# Patient Record
Sex: Female | Born: 1985 | Race: White | Hispanic: No | Marital: Married | Smoking: Current every day smoker
Health system: Southern US, Community
[De-identification: ages and names within clinical notes are randomized; demographics above are authoritative.]

## PROBLEM LIST (undated history)

## (undated) DIAGNOSIS — N200 Calculus of kidney: Secondary | ICD-10-CM

## (undated) HISTORY — PX: TUBAL LIGATION: SHX77

## (undated) HISTORY — PX: CHOLECYSTECTOMY: SHX55

---

## 2013-12-21 ENCOUNTER — Emergency Department (HOSPITAL_BASED_OUTPATIENT_CLINIC_OR_DEPARTMENT_OTHER)
Admission: EM | Admit: 2013-12-21 | Discharge: 2013-12-21 | Disposition: A | Discharge status: Home / Self Care | Payer: Worker's Compensation | Attending: Emergency Medicine | Admitting: Emergency Medicine

## 2013-12-21 ENCOUNTER — Encounter (HOSPITAL_BASED_OUTPATIENT_CLINIC_OR_DEPARTMENT_OTHER): Payer: Self-pay | Admitting: Emergency Medicine

## 2013-12-21 DIAGNOSIS — S61209A Unspecified open wound of unspecified finger without damage to nail, initial encounter: Secondary | ICD-10-CM | POA: Diagnosis present

## 2013-12-21 DIAGNOSIS — F17200 Nicotine dependence, unspecified, uncomplicated: Secondary | ICD-10-CM | POA: Diagnosis not present

## 2013-12-21 DIAGNOSIS — W268XXA Contact with other sharp object(s), not elsewhere classified, initial encounter: Secondary | ICD-10-CM | POA: Diagnosis not present

## 2013-12-21 DIAGNOSIS — Y9389 Activity, other specified: Secondary | ICD-10-CM | POA: Insufficient documentation

## 2013-12-21 DIAGNOSIS — S61215A Laceration without foreign body of left ring finger without damage to nail, initial encounter: Secondary | ICD-10-CM

## 2013-12-21 DIAGNOSIS — Z87442 Personal history of urinary calculi: Secondary | ICD-10-CM | POA: Diagnosis not present

## 2013-12-21 DIAGNOSIS — Y929 Unspecified place or not applicable: Secondary | ICD-10-CM | POA: Diagnosis not present

## 2013-12-21 HISTORY — DX: Calculus of kidney: N20.0

## 2013-12-21 NOTE — ED Provider Notes (Signed)
CSN: [id number]     Arrival date & time 12/21/13  0827 History   First MD Initiated Contact with Patient 12/21/13 [phone]     Chief Complaint  Patient presents with  . Extremity Laceration     (Consider location/radiation/quality/duration/timing/severity/associated sxs/prior Treatment) HPI Comments: Patient is a 28 year old female. She is lacerated the tip of her left ring finger while slicing lettuce. This was done at work. Last tetanus shot was 3 months ago.  The history is provided by the patient.    Past Medical History  Diagnosis Date  . Nephrolithiasis    Past Surgical History  Procedure Laterality Date  . Cholecystectomy    . Cesarean section     No family history on file. History  Substance Use Topics  . Smoking status: Current Every Day Smoker  . Smokeless tobacco: Not on file  . Alcohol Use: No   OB History   Grav Para Term Preterm Abortions TAB SAB Ect Mult Living                 Review of Systems  All other systems reviewed and are negative.     Allergies  Bactrim  Home Medications   Prior to Admission medications   Not on File   BP 122/82  Pulse 99  Temp(Src) 98.3 F (36.8 C) (Oral)  Resp 14  Ht  (1.549 m)  Wt 180 lb (81.647 kg)  BMI 34.03 kg/m2  SpO2 100%  LMP 12/21/2013 Physical Exam  Constitutional: She is oriented to person, place, and time. She appears well-developed and well-nourished.  HENT:  Head: Normocephalic and atraumatic.  Neck: Normal range of motion. Neck supple.  Neurological: She is alert and oriented to person, place, and time.  Skin: Skin is warm and dry.  The left ring finger is noted to have a laceration causing a small flap. The laceration extends through the lateral edge of the nail.    ED Course  Procedures (including critical care time) Labs Review Labs Reviewed - No data to display  Imaging Review No results found.   EKG Interpretation None      MDM   Final diagnoses:  None    Laceration  repaired with Dermabond without the use of any sedation or local anesthesia. She will be placed in a dressing and advised to followup as needed.    Geoffery Lyons, MD 12/21/13 [phone]

## 2013-12-21 NOTE — Discharge Instructions (Signed)
Keep laceration covered. Wear splint as applied for comfort and protection.  Return to the emergency department if you experience any difficulties.   Laceration Care, Adult A laceration is a cut or lesion that goes through all layers of the skin and into the tissue just beneath the skin. TREATMENT  Some lacerations may not require closure. Some lacerations may not be able to be closed due to an increased risk of infection. It is important to see your caregiver as soon as possible after an injury to minimize the risk of infection and maximize the opportunity for successful closure. If closure is appropriate, pain medicines may be given, if needed. The wound will be cleaned to help prevent infection. Your caregiver will use stitches (sutures), staples, wound glue (adhesive), or skin adhesive strips to repair the laceration. These tools bring the skin edges together to allow for faster healing and a better cosmetic outcome. However, all wounds will heal with a scar. Once the wound has healed, scarring can be minimized by covering the wound with sunscreen during the day for 1 full year. HOME CARE INSTRUCTIONS  For sutures or staples:  Keep the wound clean and dry.  If you were given a bandage (dressing), you should change it at least once a day. Also, change the dressing if it becomes wet or dirty, or as directed by your caregiver.  Wash the wound with soap and water 2 times a day. Rinse the wound off with water to remove all soap. Pat the wound dry with a clean towel.  After cleaning, apply a thin layer of the antibiotic ointment as recommended by your caregiver. This will help prevent infection and keep the dressing from sticking.  You may shower as usual after the first 24 hours. Do not soak the wound in water until the sutures are removed.  Only take over-the-counter or prescription medicines for pain, discomfort, or fever as directed by your caregiver.  Get your sutures or staples removed  as directed by your caregiver. For skin adhesive strips:  Keep the wound clean and dry.  Do not get the skin adhesive strips wet. You may bathe carefully, using caution to keep the wound dry.  If the wound gets wet, pat it dry with a clean towel.  Skin adhesive strips will fall off on their own. You may trim the strips as the wound heals. Do not remove skin adhesive strips that are still stuck to the wound. They will fall off in time. For wound adhesive:  You may briefly wet your wound in the shower or bath. Do not soak or scrub the wound. Do not swim. Avoid periods of heavy perspiration until the skin adhesive has fallen off on its own. After showering or bathing, gently pat the wound dry with a clean towel.  Do not apply liquid medicine, cream medicine, or ointment medicine to your wound while the skin adhesive is in place. This may loosen the film before your wound is healed.  If a dressing is placed over the wound, be careful not to apply tape directly over the skin adhesive. This may cause the adhesive to be pulled off before the wound is healed.  Avoid prolonged exposure to sunlight or tanning lamps while the skin adhesive is in place. Exposure to ultraviolet light in the first year will darken the scar.  The skin adhesive will usually remain in place for 5 to 10 days, then naturally fall off the skin. Do not pick at the adhesive film. You  may need a tetanus shot if:  You cannot remember when you had your last tetanus shot.  You have never had a tetanus shot. If you get a tetanus shot, your arm may swell, get red, and feel warm to the touch. This is common and not a problem. If you need a tetanus shot and you choose not to have one, there is a rare chance of getting tetanus. Sickness from tetanus can be serious. SEEK MEDICAL CARE IF:   You have redness, swelling, or increasing pain in the wound.  You see a red line that goes away from the wound.  You have yellowish-white fluid  (pus) coming from the wound.  You have a fever.  You notice a bad smell coming from the wound or dressing.  Your wound breaks open before or after sutures have been removed.  You notice something coming out of the wound such as wood or glass.  Your wound is on your hand or foot and you cannot move a finger or toe. SEEK IMMEDIATE MEDICAL CARE IF:   Your pain is not controlled with prescribed medicine.  You have severe swelling around the wound causing pain and numbness or a change in color in your arm, hand, leg, or foot.  Your wound splits open and starts bleeding.  You have worsening numbness, weakness, or loss of function of any joint around or beyond the wound.  You develop painful lumps near the wound or on the skin anywhere on your body. MAKE SURE YOU:   Understand these instructions.  Will watch your condition.  Will get help right away if you are not doing well or get worse. Document Released: 04/02/2005 Document Revised: 06/25/2011 Document Reviewed: 09/26/2010 [facility] Patient Information 2015 [place], [place]. This information is not intended to replace advice given to you by your health care provider. Make sure you discuss any questions you have with your health care provider.

## 2013-12-21 NOTE — ED Notes (Signed)
Pt sts cut finger while using slicer at work. Laceration to tip of left 4th digit. Bleeding minimal at this time. Pressure being held with gauze. Last tetanus 3 months ago.

## 2013-12-23 ENCOUNTER — Encounter (HOSPITAL_BASED_OUTPATIENT_CLINIC_OR_DEPARTMENT_OTHER): Payer: Self-pay | Admitting: Emergency Medicine

## 2013-12-23 ENCOUNTER — Emergency Department (HOSPITAL_BASED_OUTPATIENT_CLINIC_OR_DEPARTMENT_OTHER)
Admission: EM | Admit: 2013-12-23 | Discharge: 2013-12-23 | Disposition: A | Discharge status: Home / Self Care | Payer: Worker's Compensation | Attending: Emergency Medicine | Admitting: Emergency Medicine

## 2013-12-23 ENCOUNTER — Emergency Department (HOSPITAL_BASED_OUTPATIENT_CLINIC_OR_DEPARTMENT_OTHER): Payer: Worker's Compensation

## 2013-12-23 DIAGNOSIS — F17200 Nicotine dependence, unspecified, uncomplicated: Secondary | ICD-10-CM | POA: Diagnosis not present

## 2013-12-23 DIAGNOSIS — Z4801 Encounter for change or removal of surgical wound dressing: Secondary | ICD-10-CM | POA: Insufficient documentation

## 2013-12-23 DIAGNOSIS — Z5189 Encounter for other specified aftercare: Secondary | ICD-10-CM

## 2013-12-23 DIAGNOSIS — Z87442 Personal history of urinary calculi: Secondary | ICD-10-CM | POA: Insufficient documentation

## 2013-12-23 MED ORDER — IBUPROFEN 800 MG PO TABS
800.0000 mg | ORAL_TABLET | Freq: Once | ORAL | Status: AC
  Administered 2013-12-23: 800 mg via ORAL
  Filled 2013-12-23: qty 1

## 2013-12-23 MED ORDER — NAPROXEN 375 MG PO TABS: 375.0000 mg | ORAL_TABLET | Freq: Two times a day (BID) | ORAL | Status: AC

## 2013-12-23 MED ORDER — CEPHALEXIN 500 MG PO CAPS: 500.0000 mg | ORAL_CAPSULE | Freq: Three times a day (TID) | ORAL | Status: DC

## 2013-12-23 NOTE — ED Notes (Signed)
MD at bedside. 

## 2013-12-23 NOTE — ED Notes (Signed)
Laceration to left 4th finger 9/7.  Repaired here.  Pt sts her finger is throbbing and has shooting pains that are getting worse.

## 2013-12-23 NOTE — ED Provider Notes (Signed)
CSN: [id number]     Arrival date & time 12/23/13  0026 History   First MD Initiated Contact with Patient 12/23/13 0054     Chief Complaint  Patient presents with  . Wound Check     (Consider location/radiation/quality/duration/timing/severity/associated sxs/prior Treatment) Patient is a 28 y.o. female presenting with wound check. The history is provided by the patient.  Wound Check This is a new problem. The current episode started 2 days ago. The problem occurs constantly. The problem has not changed since onset.Pertinent negatives include no chest pain, no abdominal pain, no headaches and no shortness of breath. Nothing aggravates the symptoms. Nothing relieves the symptoms. She has tried nothing for the symptoms. The treatment provided no relief.  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  No redness no warmth no fluctuance  Past Medical History  Diagnosis Date  . Nephrolithiasis    Past Surgical History  Procedure Laterality Date  . Cholecystectomy    . Cesarean section    . Tubal ligation     No family history on file. History  Substance Use Topics  . Smoking status: Current Every Day Smoker -- 0.50 packs/day  . Smokeless tobacco: Not on file  . Alcohol Use: No   OB History   Grav Para Term Preterm Abortions TAB SAB Ect Mult Living                 Review of Systems  Respiratory: Negative for shortness of breath.   Cardiovascular: Negative for chest pain.  Gastrointestinal: Negative for abdominal pain.  Neurological: Negative for headaches.  All other systems reviewed and are negative.     Allergies  Bactrim  Home Medications   Prior to Admission medications   Not on File   BP 126/84  Pulse 72  Temp(Src) 98.1 F (36.7 C) (Oral)  Resp 18  Ht  (1.549 m)  Wt 180 lb (81.647 kg)  BMI 34.03 kg/m2  SpO2 99%  LMP 12/21/2013 Physical Exam  Constitutional: She is oriented to person, place, and  time. She appears well-developed and well-nourished. No distress.  HENT:  Head: Normocephalic and atraumatic.  Mouth/Throat: Oropharynx is clear and moist.  Eyes: Conjunctivae and EOM are normal.  Neck: Normal range of motion. Neck supple.  Cardiovascular: Normal rate, regular rhythm and intact distal pulses.   Pulmonary/Chest: Effort normal and breath sounds normal. She has no wheezes.  Abdominal: Soft. Bowel sounds are normal. There is no tenderness. There is no rebound and no guarding.  Musculoskeletal: Normal range of motion. She exhibits no edema.       Left hand: She exhibits normal range of motion, no bony tenderness, normal two-point discrimination, normal capillary refill and no deformity. Normal sensation noted. Normal strength noted.  Laceration glue is clean dry and intact no purulence, no erythema no streaking  Neurological: She is alert and oriented to person, place, and time. She has normal reflexes.  Skin: Skin is warm and dry.  Laceration is closed no warmth erythema or fluctuance   Psychiatric: She has a normal mood and affect.    ED Course  Procedures (including critical care time) Labs Review Labs Reviewed - No data to display  Imaging Review Dg Finger Ring Left  12/23/2013   CLINICAL DATA:  Finger laceration  EXAM: LEFT RING FINGER 2+V  COMPARISON:  None.  FINDINGS: No fracture or dislocation is seen.  The joint spaces are preserved.  Soft tissue laceration along the ulnar aspect of the distal phalanx.  No radiopaque foreign body is seen.  IMPRESSION: Soft tissue laceration along the distal phalanx.  No fracture, dislocation, or radiopaque foreign body is seen.   Electronically Signed   By: Charline Bills M.D.   On: 12/23/2013 01:11     EKG Interpretation None      MDM   Final diagnoses:  None    Xrayed no bony injury NSAIDS ice and elevation    Trishelle Devora K Baylin Gamblin-Rasch, MD 12/23/13 [phone]

## 2013-12-23 NOTE — ED Notes (Signed)
Patient transported to X-ray ambulatory with tech. 

## 2014-01-02 ENCOUNTER — Emergency Department (HOSPITAL_BASED_OUTPATIENT_CLINIC_OR_DEPARTMENT_OTHER)
Admission: EM | Admit: 2014-01-02 | Discharge: 2014-01-02 | Disposition: A | Discharge status: Home / Self Care | Payer: Worker's Compensation | Attending: Emergency Medicine | Admitting: Emergency Medicine

## 2014-01-02 ENCOUNTER — Encounter (HOSPITAL_BASED_OUTPATIENT_CLINIC_OR_DEPARTMENT_OTHER): Payer: Self-pay | Admitting: Emergency Medicine

## 2014-01-02 DIAGNOSIS — F17200 Nicotine dependence, unspecified, uncomplicated: Secondary | ICD-10-CM | POA: Insufficient documentation

## 2014-01-02 DIAGNOSIS — Z87442 Personal history of urinary calculi: Secondary | ICD-10-CM | POA: Diagnosis not present

## 2014-01-02 DIAGNOSIS — Z792 Long term (current) use of antibiotics: Secondary | ICD-10-CM | POA: Insufficient documentation

## 2014-01-02 DIAGNOSIS — Z791 Long term (current) use of non-steroidal anti-inflammatories (NSAID): Secondary | ICD-10-CM | POA: Insufficient documentation

## 2014-01-02 DIAGNOSIS — Z4801 Encounter for change or removal of surgical wound dressing: Secondary | ICD-10-CM | POA: Insufficient documentation

## 2014-01-02 DIAGNOSIS — Z5189 Encounter for other specified aftercare: Secondary | ICD-10-CM

## 2014-01-02 NOTE — ED Provider Notes (Signed)
CSN: [id number]     Arrival date & time 01/02/14  1433 History   First MD Initiated Contact with Patient 01/02/14 1459     Chief Complaint  Patient presents with  . Wound Check     (Consider location/radiation/quality/duration/timing/severity/associated sxs/prior Treatment) HPI Comments: Pt states the glue has started to peel off her wound and her boss told her to get it checked.   Patient is a 28 y.o. female presenting with wound check. The history is provided by the patient.  Wound Check This is a new problem. The current episode started more than 2 days ago. The problem occurs constantly. The problem has not changed since onset.Pertinent negatives include no chest pain, no abdominal pain, no headaches and no shortness of breath. Nothing aggravates the symptoms. Nothing relieves the symptoms. She has tried nothing for the symptoms. The treatment provided no relief.    Past Medical History  Diagnosis Date  . Nephrolithiasis    Past Surgical History  Procedure Laterality Date  . Cholecystectomy    . Cesarean section    . Tubal ligation     No family history on file. History  Substance Use Topics  . Smoking status: Current Every Day Smoker -- 0.50 packs/day  . Smokeless tobacco: Not on file  . Alcohol Use: No   OB History   Grav Para Term Preterm Abortions TAB SAB Ect Mult Living                 Review of Systems  Constitutional: Negative for fever, chills, diaphoresis, activity change, appetite change and fatigue.  HENT: Negative for congestion, facial swelling, rhinorrhea and sore throat.   Eyes: Negative for photophobia and discharge.  Respiratory: Negative for cough, chest tightness and shortness of breath.   Cardiovascular: Negative for chest pain, palpitations and leg swelling.  Gastrointestinal: Negative for nausea, vomiting, abdominal pain and diarrhea.  Endocrine: Negative for polydipsia and polyuria.  Genitourinary: Negative for dysuria, frequency, difficulty  urinating and pelvic pain.  Musculoskeletal: Negative for arthralgias, back pain, neck pain and neck stiffness.  Skin: Negative for color change and wound.  Allergic/Immunologic: Negative for immunocompromised state.  Neurological: Negative for facial asymmetry, weakness, numbness and headaches.  Hematological: Does not bruise/bleed easily.  Psychiatric/Behavioral: Negative for confusion and agitation.      Allergies  Bactrim  Home Medications   Prior to Admission medications   Medication Sig Start Date End Date Taking? Authorizing Provider  cephALEXin (KEFLEX) 500 MG capsule Take 1 capsule (500 mg total) by mouth 3 (three) times daily. 12/23/13  Yes April K Palumbo-Rasch, MD  naproxen (NAPROSYN) 375 MG tablet Take 1 tablet (375 mg total) by mouth 2 (two) times daily. 12/23/13  Yes April K Palumbo-Rasch, MD   BP 119/70  Pulse 67  Temp(Src) 98.1 F (36.7 C) (Oral)  Resp 16  Ht  (1.549 m)  Wt 180 lb (81.647 kg)  BMI 34.03 kg/m2  SpO2 100%  LMP 12/21/2013 Physical Exam  Constitutional: She is oriented to person, place, and time. She appears well-developed and well-nourished. No distress.  HENT:  Head: Normocephalic and atraumatic.  Mouth/Throat: No oropharyngeal exudate.  Eyes: Pupils are equal, round, and reactive to light.  Neck: Normal range of motion. Neck supple.  Cardiovascular: Normal rate, regular rhythm and normal heart sounds.  Exam reveals no gallop and no friction rub.   No murmur heard. Pulmonary/Chest: Effort normal and breath sounds normal. No respiratory distress. She has no wheezes. She has no rales.  Abdominal: Soft. Bowel sounds are normal. She exhibits no distension and no mass. There is no tenderness. There is no rebound and no guarding.  Musculoskeletal: Normal range of motion. She exhibits no edema and no tenderness.       Hands: Neurological: She is alert and oriented to person, place, and time.  Skin: Skin is warm and dry.  Psychiatric: She has a  normal mood and affect.    ED Course  Procedures (including critical care time) Labs Review Labs Reviewed - No data to display  Imaging Review No results found.   EKG Interpretation None      MDM   Final diagnoses:  Visit for wound check    Pt is a 28 y.o. female with Pmhx as above who presents with request for wound check as the skin adhesive is starting to come off from lac that was repaired about 5 days ago. No s/sx of infection. She had XR and tdap at time of initial injury. Wound appears to be healing well, she can continue to keep it covered at work.         Toy Cookey, MD 01/02/14 [phone]

## 2014-01-02 NOTE — ED Notes (Signed)
Pt had finger glued after laceration at work and her boss wanted her to have it checked

## 2014-01-02 NOTE — Discharge Instructions (Signed)

## 2014-01-23 ENCOUNTER — Encounter (HOSPITAL_BASED_OUTPATIENT_CLINIC_OR_DEPARTMENT_OTHER): Payer: Self-pay | Admitting: Emergency Medicine

## 2014-01-23 ENCOUNTER — Emergency Department (HOSPITAL_BASED_OUTPATIENT_CLINIC_OR_DEPARTMENT_OTHER)
Admission: EM | Admit: 2014-01-23 | Discharge: 2014-01-23 | Disposition: A | Discharge status: Home / Self Care | Payer: Worker's Compensation | Attending: Emergency Medicine | Admitting: Emergency Medicine

## 2014-01-23 DIAGNOSIS — Z4801 Encounter for change or removal of surgical wound dressing: Secondary | ICD-10-CM | POA: Diagnosis present

## 2014-01-23 DIAGNOSIS — Z87442 Personal history of urinary calculi: Secondary | ICD-10-CM | POA: Diagnosis not present

## 2014-01-23 DIAGNOSIS — Z791 Long term (current) use of non-steroidal anti-inflammatories (NSAID): Secondary | ICD-10-CM | POA: Diagnosis not present

## 2014-01-23 DIAGNOSIS — X58XXXS Exposure to other specified factors, sequela: Secondary | ICD-10-CM | POA: Diagnosis not present

## 2014-01-23 DIAGNOSIS — S61214S Laceration without foreign body of right ring finger without damage to nail, sequela: Secondary | ICD-10-CM | POA: Diagnosis not present

## 2014-01-23 DIAGNOSIS — S61219S Laceration without foreign body of unspecified finger without damage to nail, sequela: Secondary | ICD-10-CM

## 2014-01-23 DIAGNOSIS — Z720 Tobacco use: Secondary | ICD-10-CM | POA: Diagnosis not present

## 2014-01-23 NOTE — ED Notes (Signed)
Patient here at request from her employer to have left hand ring finger clue rechecked, originally treated 1 month ago.

## 2014-01-23 NOTE — ED Provider Notes (Signed)
CSN: [id number]     Arrival date & time 01/23/14  1944 History  This chart was scribed for Hilario Quarryanielle S Kile Kabler, MD by Tonye RoyaltyJoshua Chen, ED Scribe. This patient was seen in room MHT13/MHT13 and the patient's care was started at 8:48 PM.    Chief Complaint  Patient presents with  . Wound Check   Patient is a 28 y.o. female presenting with wound check. The history is provided by the patient. No language interpreter was used.  Wound Check This is a new problem. The current episode started more than 1 week ago. The problem occurs constantly. The problem has been resolved. Nothing aggravates the symptoms. Nothing relieves the symptoms.   HPI Comments: Sarah Ruiz is a 28 y.o. female who presents to the Emergency Department for wound recheck of a laceration to her left ring finger sustained on 12/21/2013 that was treated with "glue." She reports anxiety about removing the glue. She denies knowledge of diabetes or peripheral circulation problem. She states her tetanus was updated when she was initially seen. She states she has been seen here twice previously for wound recheck. She states she has avoided getting it wet or washing the glue off.  Past Medical History  Diagnosis Date  . Nephrolithiasis    Past Surgical History  Procedure Laterality Date  . Cholecystectomy    . Cesarean section    . Tubal ligation     No family history on file. History  Substance Use Topics  . Smoking status: Current Every Day Smoker -- 0.50 packs/day  . Smokeless tobacco: Not on file  . Alcohol Use: No   OB History   Grav Para Term Preterm Abortions TAB SAB Ect Mult Living                 Review of Systems  Constitutional: Negative for fever.  Gastrointestinal: Negative for nausea and vomiting.  Skin: Positive for wound (healed, with glue on still).  All other systems reviewed and are negative.     Allergies  Bactrim  Home Medications   Prior to Admission medications   Medication Sig Start Date End Date  Taking? Authorizing Provider  naproxen (NAPROSYN) 375 MG tablet Take 1 tablet (375 mg total) by mouth 2 (two) times daily. 12/23/13   April K Palumbo-Rasch, MD   BP 97/60  Pulse 62  Temp(Src) 98.4 F (36.9 C) (Oral)  Resp 18  Wt 180 lb (81.647 kg)  SpO2 100% Physical Exam  Nursing note and vitals reviewed. Constitutional: She is oriented to person, place, and time. She appears well-developed and well-nourished.  HENT:  Head: Normocephalic and atraumatic.  Eyes: Pupils are equal, round, and reactive to light.  Musculoskeletal: Normal range of motion. She exhibits no tenderness.       Hands: Healed wound with glue in place  Neurological: She is alert and oriented to person, place, and time.  Skin: Skin is warm and dry.  Psychiatric: She has a normal mood and affect.    ED Course  Procedures (including critical care time) Labs Review Labs Reviewed - No data to display  Imaging Review No results found.   EKG Interpretation None     DIAGNOSTIC STUDIES: Oxygen Saturation is 100% on room air, normal by my interpretation.    COORDINATION OF CARE: 8:53 PM Discussed with the patient that the wound should be healed at this point and instructed her to wash the area to remove the glue. She agrees with the plan and has no further  questions at this time.   MDM   Final diagnoses:  Finger laceration, sequela   I personally performed the services described in this documentation, which was scribed in my presence. The recorded information has been reviewed and considered.   Hilario Quarryanielle S Christee Mervine, MD 01/23/14 [phone]

## 2014-01-23 NOTE — Discharge Instructions (Signed)
Please soak until glue comes off.

## 2015-01-22 IMAGING — CR DG FINGER RING 2+V*L*
3 series · 3 of 3 positions shown · non-contrast
Comparison: None.

CLINICAL DATA: Finger laceration

EXAM:
LEFT RING FINGER 2+V

[x finger pa left]
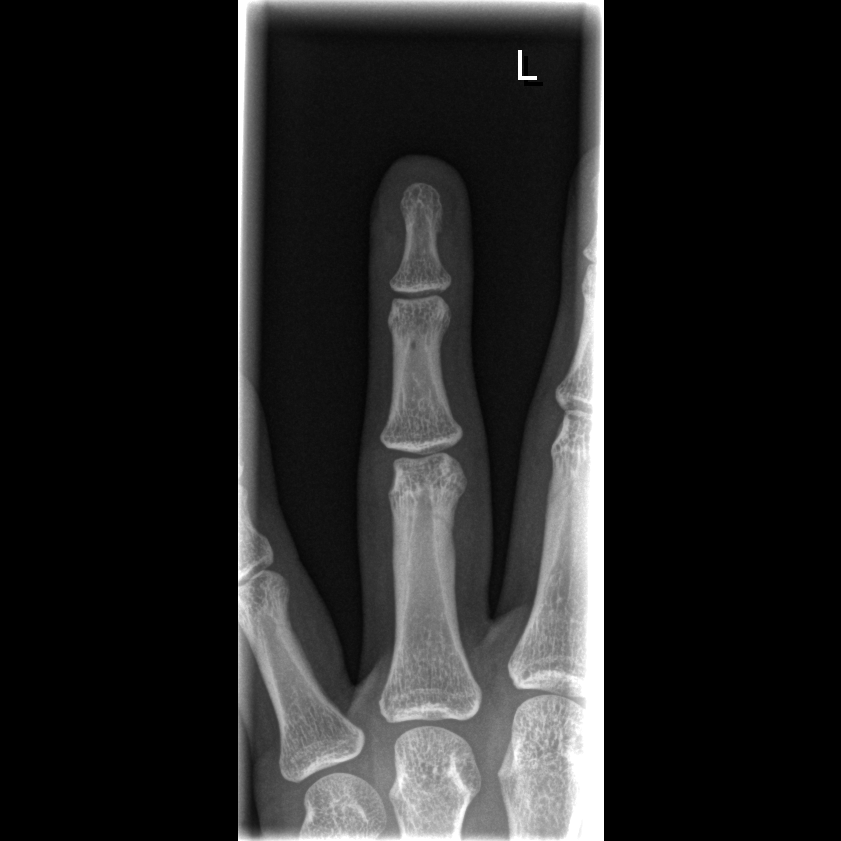

[x finger obl. left]
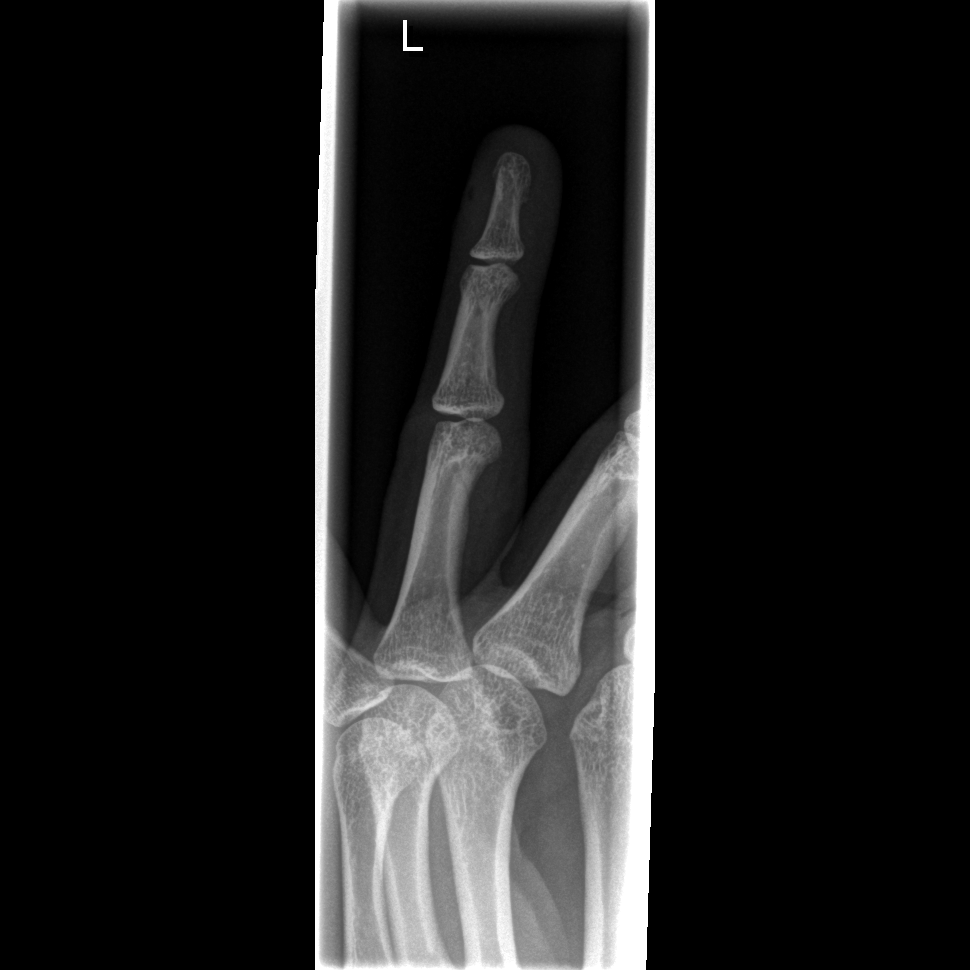

[x finger lateral left]
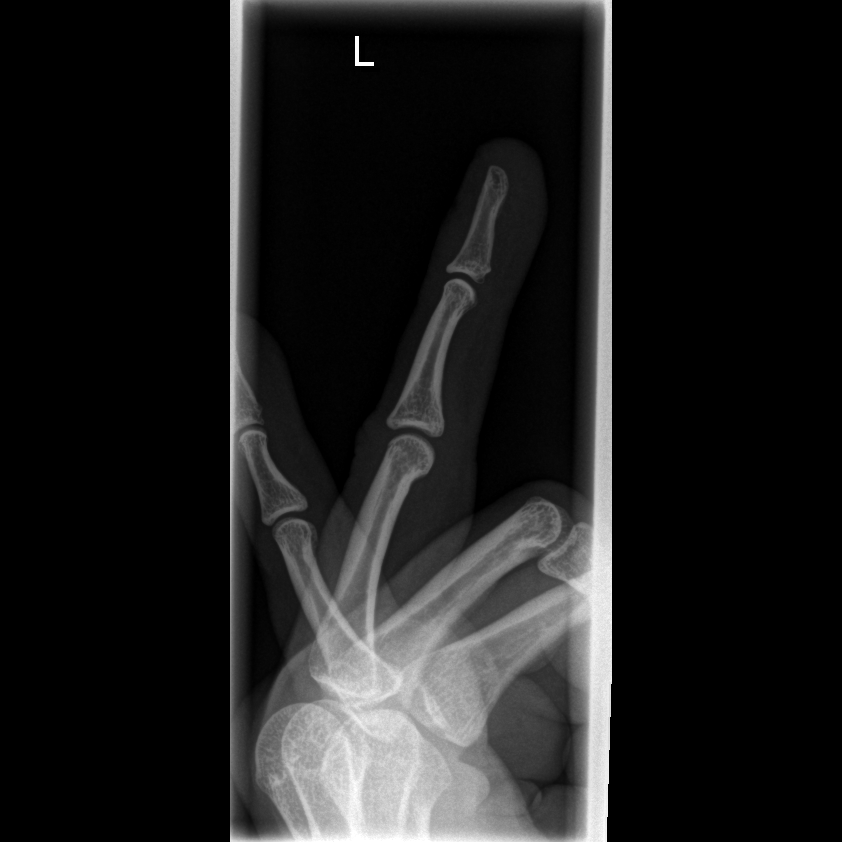

[3 of 3 positions shown; findings below may reference images not displayed]

FINDINGS: No fracture or dislocation is seen.

The joint spaces are preserved.

Soft tissue laceration along the ulnar aspect of the distal phalanx.

No radiopaque foreign body is seen.
IMPRESSION: Soft tissue laceration along the distal phalanx.

No fracture, dislocation, or radiopaque foreign body is seen.
